# Patient Record
Sex: Male | Born: 1980 | Hispanic: No | Marital: Married | Smoking: Never smoker
Health system: Southern US, Community
[De-identification: ages and names within clinical notes are randomized; demographics above are authoritative.]

## PROBLEM LIST (undated history)

## (undated) DIAGNOSIS — I10 Essential (primary) hypertension: Secondary | ICD-10-CM

## (undated) HISTORY — PX: APPENDECTOMY: SHX54

## (undated) HISTORY — PX: HAND SURGERY: SHX662

---

## 2020-03-21 ENCOUNTER — Other Ambulatory Visit: Payer: Self-pay

## 2020-03-21 ENCOUNTER — Ambulatory Visit: Payer: HRSA Program | Attending: Internal Medicine

## 2020-03-21 DIAGNOSIS — Z20822 Contact with and (suspected) exposure to covid-19: Secondary | ICD-10-CM | POA: Diagnosis not present

## 2020-03-22 LAB — NOVEL CORONAVIRUS, NAA: SARS-CoV-2, NAA: NOT DETECTED

## 2020-03-22 LAB — SARS-COV-2, NAA 2 DAY TAT

## 2020-08-14 ENCOUNTER — Emergency Department: Payer: PRIVATE HEALTH INSURANCE

## 2020-08-14 ENCOUNTER — Other Ambulatory Visit: Payer: Self-pay

## 2020-08-14 ENCOUNTER — Emergency Department
Admission: EM | Admit: 2020-08-14 | Discharge: 2020-08-14 | Disposition: A | Discharge status: Home / Self Care | Payer: PRIVATE HEALTH INSURANCE | Attending: Emergency Medicine | Admitting: Emergency Medicine

## 2020-08-14 DIAGNOSIS — Y939 Activity, unspecified: Secondary | ICD-10-CM | POA: Insufficient documentation

## 2020-08-14 DIAGNOSIS — I10 Essential (primary) hypertension: Secondary | ICD-10-CM | POA: Insufficient documentation

## 2020-08-14 DIAGNOSIS — Y929 Unspecified place or not applicable: Secondary | ICD-10-CM | POA: Insufficient documentation

## 2020-08-14 DIAGNOSIS — Z79899 Other long term (current) drug therapy: Secondary | ICD-10-CM | POA: Insufficient documentation

## 2020-08-14 DIAGNOSIS — Y990 Civilian activity done for income or pay: Secondary | ICD-10-CM | POA: Insufficient documentation

## 2020-08-14 DIAGNOSIS — S161XXA Strain of muscle, fascia and tendon at neck level, initial encounter: Secondary | ICD-10-CM | POA: Insufficient documentation

## 2020-08-14 DIAGNOSIS — X58XXXA Exposure to other specified factors, initial encounter: Secondary | ICD-10-CM | POA: Insufficient documentation

## 2020-08-14 MED ORDER — ORPHENADRINE CITRATE ER 100 MG PO TB12: 100.0000 mg | ORAL_TABLET | Freq: Two times a day (BID) | ORAL | 1 refills | Status: DC | PRN

## 2020-08-14 MED ORDER — TRAMADOL HCL 50 MG PO TABS: 50.0000 mg | ORAL_TABLET | Freq: Four times a day (QID) | ORAL | 0 refills | Status: DC | PRN

## 2020-08-14 MED ORDER — AMLODIPINE BESYLATE 10 MG PO TABS: 10.0000 mg | ORAL_TABLET | Freq: Every day | ORAL | 2 refills | Status: DC
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
## 2020-08-14 NOTE — Discharge Instructions (Signed)
CT findings are consistent with muscle strain of the cervical spine.  Advised to take medication as directed.  It was also noted your blood pressure is elevated.  Will be given a prescription for Norvasc at 10 mg to take daily.  Advised establish care for PCP for management.  In the next 3 days advised to go to any pharmacy and have your blood pressure taken and recorded.  Take those readings to have a doctor he decides to establish care so he can have an average of your blood pressure readings.

## 2020-08-14 NOTE — ED Triage Notes (Signed)
Reports right sided neck pain X 8 months, "ball on my shoulder". Takes ibuprofen with relief, but pain back once ibuprofen wears off. Ambulates without difficulty.  Pt alert and oriented X4, cooperative, RR even and unlabored, color WNL. Pt in NAD.

## 2020-12-09 ENCOUNTER — Encounter: Payer: Self-pay | Admitting: Emergency Medicine

## 2020-12-09 ENCOUNTER — Other Ambulatory Visit: Payer: Self-pay

## 2020-12-09 ENCOUNTER — Emergency Department: Payer: HRSA Program

## 2020-12-09 ENCOUNTER — Emergency Department
Admission: EM | Admit: 2020-12-09 | Discharge: 2020-12-09 | Disposition: A | Discharge status: Home / Self Care | Payer: HRSA Program | Attending: Emergency Medicine | Admitting: Emergency Medicine

## 2020-12-09 DIAGNOSIS — U071 COVID-19: Secondary | ICD-10-CM | POA: Insufficient documentation

## 2020-12-09 DIAGNOSIS — I10 Essential (primary) hypertension: Secondary | ICD-10-CM | POA: Insufficient documentation

## 2020-12-09 DIAGNOSIS — R509 Fever, unspecified: Secondary | ICD-10-CM | POA: Diagnosis present

## 2020-12-09 DIAGNOSIS — Z79899 Other long term (current) drug therapy: Secondary | ICD-10-CM | POA: Insufficient documentation

## 2020-12-09 HISTORY — DX: Essential (primary) hypertension: I10

## 2020-12-09 LAB — RESP PANEL BY RT-PCR (FLU A&B, COVID) ARPGX2
Influenza A by PCR: NEGATIVE
Influenza B by PCR: NEGATIVE
SARS Coronavirus 2 by RT PCR: POSITIVE — AB

## 2020-12-09 MED ORDER — AMLODIPINE BESYLATE 10 MG PO TABS: 10.0000 mg | ORAL_TABLET | Freq: Every day | ORAL | 2 refills | Status: AC

## 2020-12-09 NOTE — ED Triage Notes (Addendum)
Patient to ER for c/o generalized body aches, fever (101-102), cough (nonproductive other than small amount of white phlegm intermittently). Patient reports being around someone that tested positive for Covid. Patient also reports chest tightness. Symptoms began on Friday.

## 2020-12-09 NOTE — ED Provider Notes (Signed)
[facility] Emergency Department Provider Note   ____________________________________________   Event Date/Time   First MD Initiated Contact with Patient 12/09/20 1016     (approximate)  I have reviewed the triage vital signs and the nursing notes.   HISTORY  Chief Complaint Fever, Generalized Body Aches, and Cough   HPI Steven Moran is a 39 y.o. male presents to the ED with complaint of generalized body aches, fever between 101-102, nonproductive cough that was initially productive and some tightness in his chest.  Patient states that he was around someone who tested positive for Covid.  Patient reports that he did get the [employer] vaccine in September.  Patient is not a smoker.  He denies any previous respiratory problems.  Rates pain as 7 out of 10.       Past Medical History:  Diagnosis Date  . Hypertension     There are no problems to display for this patient.   Past Surgical History:  Procedure Laterality Date  . APPENDECTOMY    . HAND SURGERY      Prior to Admission medications   Medication Sig Start Date End Date Taking? Authorizing Provider  amLODipine (NORVASC) 10 MG tablet Take 1 tablet (10 mg total) by mouth daily. 12/09/20 12/09/21  Tommi Rumps, PA-C    Allergies Patient has no known allergies.  No family history on file.  Social History Social History   Tobacco Use  . Smoking status: Never Smoker  . Smokeless tobacco: Never Used  Substance Use Topics  . Alcohol use: Never    Review of Systems Constitutional: Positive fever/chills Eyes: No visual changes. ENT: No sore throat. Cardiovascular: Denies chest pain. Respiratory: Denies shortness of breath.  Positive for cough. Gastrointestinal: No abdominal pain.  No nausea, no vomiting.  No diarrhea.  No constipation. Genitourinary: Negative for dysuria. Musculoskeletal: Positive for muscle skeletal pain. Skin: Negative for rash. Neurological:  Positive for headache, negative for focal weakness or numbness. ____________________________________________   PHYSICAL EXAM:  VITAL SIGNS: ED Triage Vitals  Enc Vitals Group     BP 12/09/20 0949 (!) 153/105     Pulse Rate 12/09/20 0949 96     Resp 12/09/20 0949 18     Temp 12/09/20 0949 98.7 F (37.1 C)     Temp Source 12/09/20 0949 Oral     SpO2 12/09/20 0949 96 %     Weight 12/09/20 0950 220 lb (99.8 kg)     Height 12/09/20 0950 5\' 10"  (1.778 m)     Head Circumference --      Peak Flow --      Pain Score 12/09/20 0949 7     Pain Loc --      Pain Edu? --      Excl. in GC? --     Constitutional: Alert and oriented. Well appearing and in no acute distress. Eyes: Conjunctivae are normal. PERRL. EOMI. Head: Atraumatic. Nose: No congestion/rhinnorhea. Neck: No stridor.   Hematological/Lymphatic/Immunilogical: No cervical lymphadenopathy. Cardiovascular: Normal rate, regular rhythm. Grossly normal heart sounds.  Good peripheral circulation. Respiratory: Normal respiratory effort.  No retractions. Lungs CTAB. Gastrointestinal: Soft and nontender. No distention. Musculoskeletal: Moves upper and lower extremities with any difficulty and normal gait was noted.. Neurologic:  Normal speech and language. No gross focal neurologic deficits are appreciated. No gait instability. Skin:  Skin is warm, dry and intact. No rash noted. Psychiatric: Mood and affect are normal. Speech and behavior are normal.  ____________________________________________  LABS (all labs ordered are listed, but only abnormal results are displayed)  Labs Reviewed  RESP PANEL BY RT-PCR (FLU A&B, COVID) ARPGX2 - Abnormal; Notable for the following components:      Result Value   SARS Coronavirus 2 by RT PCR POSITIVE (*)    All other components within normal limits   ____________________________________________  EKG Reviewed by doctors on the major ED. Sinus rhythm with ventricular rate of  88 ____________________________________________  RADIOLOGY I, Tommi Rumps, personally viewed and evaluated these images (plain radiographs) as part of my medical decision making, as well as reviewing the written report by the radiologist.   Official radiology report(s): DG Chest 2 View  Result Date: 12/09/2020 CLINICAL DATA:  Chest tightness, cough. Additional history provided: Patient reports generalized body aches, fever, cough, chest tightness, symptoms since Friday. Patient reports COVID exposure. EXAM: CHEST - 2 VIEW COMPARISON:  Prior chest radiographs 10/31/2020. FINDINGS: Heart size within normal limits. Subtle patchy opacities are questioned within the right lung base. The left lung is clear. No evidence of pleural effusion or pneumothorax. No acute bony abnormality identified. No acute bony abnormality identified. IMPRESSION: Subtle patchy opacities are questioned within the right lung base and early atypical/viral pneumonia is difficult to exclude. Consider short-interval radiographic follow-up. Electronically Signed   By: Jackey Loge DO   On: 12/09/2020 10:10    ____________________________________________   PROCEDURES  Procedure(s) performed (including Critical Care):  Procedures   ____________________________________________   INITIAL IMPRESSION / ASSESSMENT AND PLAN / ED COURSE  As part of my medical decision making, I reviewed the following data within the electronic MEDICAL RECORD NUMBER Notes from prior ED visits and [place] Controlled Substance Database  39 year old male presents to the ED with onset of symptoms 4 days ago of fever, cough, congestion and generalized body aches.  Patient was in close contact with someone who tested positive for Covid.  Respiratory panel was positive for Covid and chest x-ray shows changes suspicious for an early Covid pneumonia.  Patient was made aware and also we discussed quarantine and out of work.  Patient states he took his last  blood pressure medication yesterday and does not have an appointment a doctor.  Patient lives in [place].  A prescription for Norvasc 10 mg which he takes routinely was sent to his pharmacy with instructions to follow-up at an urgent care for recheck of his blood pressure after his time in quarantine is over.  Patient is aware that he should go to the nearest hospital should he develop shortness of breath or difficulty breathing.  He also is to contact people who he has been in contact with and also remain home with family members.  ____________________________________________   FINAL CLINICAL IMPRESSION(S) / ED DIAGNOSES  Final diagnoses:  COVID-19  Elevated blood pressure reading with diagnosis of hypertension     ED Discharge Orders         Ordered    amLODipine (NORVASC) 10 MG tablet  Daily        12/09/20 1209          *Please note:  Steven Moran was evaluated in Emergency Department on 12/09/2020 for the symptoms described in the history of present illness. He was evaluated in the context of the global COVID-19 pandemic, which necessitated consideration that the patient might be at risk for infection with the SARS-CoV-2 virus that causes COVID-19. Institutional protocols and algorithms that pertain to the evaluation of patients at risk for COVID-19 are in  a state of rapid change based on information released by regulatory bodies including the CDC and federal and state organizations. These policies and algorithms were followed during the patient's care in the ED.  Some ED evaluations and interventions may be delayed as a result of limited staffing during and the pandemic.*   Note:  This document was prepared using Dragon voice recognition software and may include unintentional dictation errors.    Tommi Rumps, PA-C 12/09/20 1341    Chesley Noon, MD 12/10/20 [phone]

## 2020-12-09 NOTE — Discharge Instructions (Signed)
Follow-up with an urgent care or primary care doctor for recheck of your blood pressure.  A prescription for blood pressure medication was sent to your pharmacy with 2 refills.  This should give you plenty of time to establish with a doctor or at least be seen by an urgent care.  Take blood pressure medication every day.  Also you were positive for Covid.  Chest x-ray does show some changes associated with Covid which may develop into Covid pneumonia.  There is no medication for Covid and you should quarantine for the next 10 to 14 days from the time of your symptoms.  Contact people that you have been around to let them know that they have been exposed to Covid.  Go to the nearest emergency department if any severe worsening of your symptoms such as shortness of breath or difficulty breathing.

## 2020-12-10 ENCOUNTER — Telehealth: Payer: Self-pay | Admitting: Physician Assistant

## 2020-12-10 NOTE — Telephone Encounter (Signed)
Called to discuss with Zoila Shutter about Covid symptoms and the use of  monoclonal antibody infusion for those with mild to moderate Covid symptoms and at a high risk of hospitalization.     Pt is qualified for this infusion due to co-morbid conditions and/or a member of an at-risk group, however declines infusion at this time.  Symptoms reviewed as well as criteria for ending isolation.  Symptoms reviewed that would warrant ED/Hospital evaluation. Preventative practices reviewed. Patient verbalized understanding. Patient advised to call back if he decides that he does want to get infusion. Callback number to the infusion center given. Patient advised to go to Urgent care or ED with severe symptoms. Last date he would be eligible for infusion is 12/11/20.   Risk factors: HTN and BMI > 25.  Manson Passey, [place]

## 2021-04-01 ENCOUNTER — Encounter: Payer: Self-pay | Admitting: *Deleted

## 2021-04-01 NOTE — Progress Notes (Signed)
Reached out to Zoila Shutter to introduce myself as the office RN Navigator and explain our new patient process. Reviewed the reason for their referral and scheduled their new patient appointment along with labs. Provided address and directions to the office including call back phone number. Reviewed with patient any concerns they may have or any possible barriers to attending their appointment.   Informed patient about my role as a navigator and that I will meet with them prior to their New Patient appointment and more fully discuss what services I can provide. At this time patient has no further questions or needs.   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

## 2021-04-04 ENCOUNTER — Inpatient Hospital Stay: Payer: BC Managed Care – PPO

## 2021-04-04 ENCOUNTER — Inpatient Hospital Stay: Payer: BC Managed Care – PPO | Admitting: Hematology & Oncology

## 2021-04-16 ENCOUNTER — Encounter (HOSPITAL_BASED_OUTPATIENT_CLINIC_OR_DEPARTMENT_OTHER): Payer: Self-pay

## 2021-04-16 ENCOUNTER — Other Ambulatory Visit (HOSPITAL_BASED_OUTPATIENT_CLINIC_OR_DEPARTMENT_OTHER): Payer: Self-pay | Admitting: Internal Medicine

## 2021-04-16 ENCOUNTER — Ambulatory Visit (HOSPITAL_BASED_OUTPATIENT_CLINIC_OR_DEPARTMENT_OTHER)
Admission: RE | Admit: 2021-04-16 | Discharge: 2021-04-16 | Disposition: A | Discharge status: Home / Self Care | Payer: BC Managed Care – PPO | Source: Ambulatory Visit | Attending: Internal Medicine | Admitting: Internal Medicine

## 2021-04-16 ENCOUNTER — Other Ambulatory Visit: Payer: Self-pay

## 2021-04-16 DIAGNOSIS — R590 Localized enlarged lymph nodes: Secondary | ICD-10-CM

## 2021-04-16 MED ORDER — IOHEXOL 300 MG/ML  SOLN
100.0000 mL | Freq: Once | INTRAMUSCULAR | Status: AC | PRN
  Administered 2021-04-16: 100 mL via INTRAVENOUS

## 2021-04-18 ENCOUNTER — Other Ambulatory Visit: Payer: Self-pay

## 2021-04-18 ENCOUNTER — Encounter: Payer: Self-pay | Admitting: *Deleted

## 2021-04-18 ENCOUNTER — Other Ambulatory Visit: Payer: Self-pay | Admitting: Family

## 2021-04-18 ENCOUNTER — Encounter: Payer: Self-pay | Admitting: Hematology & Oncology

## 2021-04-18 ENCOUNTER — Inpatient Hospital Stay (HOSPITAL_BASED_OUTPATIENT_CLINIC_OR_DEPARTMENT_OTHER): Payer: BC Managed Care – PPO | Admitting: Hematology & Oncology

## 2021-04-18 ENCOUNTER — Other Ambulatory Visit (HOSPITAL_BASED_OUTPATIENT_CLINIC_OR_DEPARTMENT_OTHER): Payer: Self-pay

## 2021-04-18 ENCOUNTER — Inpatient Hospital Stay: Payer: BC Managed Care – PPO | Attending: Hematology & Oncology

## 2021-04-18 VITALS — BP 169/101 | HR 89 | Temp 99.1°F | Resp 19 | Wt 207.0 lb

## 2021-04-18 DIAGNOSIS — Z9049 Acquired absence of other specified parts of digestive tract: Secondary | ICD-10-CM

## 2021-04-18 DIAGNOSIS — R590 Localized enlarged lymph nodes: Secondary | ICD-10-CM | POA: Diagnosis present

## 2021-04-18 DIAGNOSIS — Z801 Family history of malignant neoplasm of trachea, bronchus and lung: Secondary | ICD-10-CM | POA: Diagnosis not present

## 2021-04-18 DIAGNOSIS — R509 Fever, unspecified: Secondary | ICD-10-CM | POA: Diagnosis not present

## 2021-04-18 DIAGNOSIS — Z79899 Other long term (current) drug therapy: Secondary | ICD-10-CM | POA: Diagnosis not present

## 2021-04-18 DIAGNOSIS — Z808 Family history of malignant neoplasm of other organs or systems: Secondary | ICD-10-CM

## 2021-04-18 DIAGNOSIS — R61 Generalized hyperhidrosis: Secondary | ICD-10-CM | POA: Diagnosis not present

## 2021-04-18 DIAGNOSIS — R519 Headache, unspecified: Secondary | ICD-10-CM | POA: Insufficient documentation

## 2021-04-18 DIAGNOSIS — H538 Other visual disturbances: Secondary | ICD-10-CM

## 2021-04-18 DIAGNOSIS — M542 Cervicalgia: Secondary | ICD-10-CM

## 2021-04-18 DIAGNOSIS — I10 Essential (primary) hypertension: Secondary | ICD-10-CM | POA: Diagnosis not present

## 2021-04-18 LAB — CBC WITH DIFFERENTIAL (CANCER CENTER ONLY)
Abs Immature Granulocytes: 0.02 10*3/uL (ref 0.00–0.07)
Basophils Absolute: 0.1 10*3/uL (ref 0.0–0.1)
Basophils Relative: 1 %
Eosinophils Absolute: 0.2 10*3/uL (ref 0.0–0.5)
Eosinophils Relative: 3 %
HCT: 47.7 % (ref 39.0–52.0)
Hemoglobin: 16.2 g/dL (ref 13.0–17.0)
Immature Granulocytes: 0 %
Lymphocytes Relative: 30 %
Lymphs Abs: 2 10*3/uL (ref 0.7–4.0)
MCH: 28.6 pg (ref 26.0–34.0)
MCHC: 34 g/dL (ref 30.0–36.0)
MCV: 84.3 fL (ref 80.0–100.0)
Monocytes Absolute: 0.4 10*3/uL (ref 0.1–1.0)
Monocytes Relative: 7 %
Neutro Abs: 3.8 10*3/uL (ref 1.7–7.7)
Neutrophils Relative %: 59 %
Platelet Count: 251 10*3/uL (ref 150–400)
RBC: 5.66 MIL/uL (ref 4.22–5.81)
RDW: 13 % (ref 11.5–15.5)
WBC Count: 6.5 10*3/uL (ref 4.0–10.5)
nRBC: 0 % (ref 0.0–0.2)

## 2021-04-18 LAB — CMP (CANCER CENTER ONLY)
ALT: 28 U/L (ref 0–44)
AST: 19 U/L (ref 15–41)
Albumin: 4.5 g/dL (ref 3.5–5.0)
Alkaline Phosphatase: 49 U/L (ref 38–126)
Anion gap: 8 (ref 5–15)
BUN: 21 mg/dL — ABNORMAL HIGH (ref 6–20)
CO2: 29 mmol/L (ref 22–32)
Calcium: 10 mg/dL (ref 8.9–10.3)
Chloride: 102 mmol/L (ref 98–111)
Creatinine: 1.08 mg/dL (ref 0.61–1.24)
GFR, Estimated: >60 mL/min (ref >60)
Glucose, Bld: 143 mg/dL — ABNORMAL HIGH (ref 70–99)
Potassium: 3.9 mmol/L (ref 3.5–5.1)
Sodium: 139 mmol/L (ref 135–145)
Total Bilirubin: 0.3 mg/dL (ref 0.3–1.2)
Total Protein: 7.7 g/dL (ref 6.5–8.1)

## 2021-04-18 LAB — LACTATE DEHYDROGENASE: LDH: 149 U/L (ref 98–192)

## 2021-04-18 MED ORDER — TRAMADOL HCL 50 MG PO TABS
50.0000 mg | ORAL_TABLET | Freq: Four times a day (QID) | ORAL | 0 refills | Status: AC | PRN
  Filled 2021-04-18: qty 90, 23d supply, fill #0

## 2021-04-18 NOTE — Progress Notes (Signed)
Patient plan requires no authorization. PET and MRI scheduled for patient.   Spoke to patient and reviewed appointments and prep for both scans. Also mailed a radiography info sheet to the patient for education reinforcement.  PET - patient knows to be NPO after MN and he can only drink water. He is not diabetic.  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 He will need to arrive early to his MRI to have an xray of his orbit to eval for metal shavings. Patient is aware, radiology is aware and orders placed.   Oncology Nurse Navigator Documentation  Oncology Nurse Navigator Flowsheets 04/18/2021  Abnormal Finding Date -  Diagnosis Status -  Navigator Follow Up Date: 04/28/2021  Navigator Follow Up Reason: Scan Review  Navigator Location [facility]  Referral Date to RadOnc/MedOnc -  Navigator Encounter Type Telephone;Appt/Treatment Plan Review  Telephone Appt Confirmation/Clarification;Outgoing Call  Patient Visit Type MedOnc  Treatment Phase Abnormal Scans  Barriers/Navigation Needs Coordination of Care;Education  Education Other  Interventions Coordination of Care;Education;Psycho-Social Support  Acuity Level 2-Minimal Needs (1-2 Barriers Identified)  Coordination of Care Appts;Radiology  Education Method Verbal;Written  Support Groups/Services Friends and Family  Time Spent with Patient 60

## 2021-04-18 NOTE — Progress Notes (Signed)
Referral MD  Reason for Referral: Cervical lymphadenopathy-right neck.  No chief complaint on file. : I have been having pain in my neck and fevers then night sweats.  HPI: Ms. Skillern is a very nice 40 year old male.  He is originally from [place].  It sounds like he is from [place].  He comes in with his wife.  His wife is from [place].  She speaks [place].  He actually has been incarcerated.  This is down in [place].  He currently is a Psychologist, occupational.  He is working on a job at [employer].  For the past 5 months, he has been having some issues.  He has been having some pain.  This is in the right neck.  He has had some fullness in the right neck.  He has had some fevers and night sweats.  He has had some weight loss.  His appetite is doing okay.  There is no change in bowel or bladder habits.  He may be little constipated.  Is been taking quite a bit of Tylenol.  He is also been taking some nonsteroidals.  He was put on some gabapentin which he says does not help.  He ultimately had a CT scan of the neck done.  This was done on 04/16/2021.  CT scan of the neck showed some enlarged right level 2 lymph nodes.  One measured 18 mm.  The other measured 12 mm.  There is some additional lymph nodes within bilateral neck which are prominent.  This lymphadenopathy was nonspecific.  A CT scan of the chest was also done on 04/16/2021.  The CT scan of the chest did not show any mediastinal or hilar lymph nodes.  He has had no rashes.  He has had no bleeding.  He used to smoke but stopped about a week or so ago.  He has had situation where he has had he lost vision in his left eye.  He was at a [employer].  He had sharp pain in his head.  He then lost vision in the left eye for a few seconds.  Not sure exactly what this might indicate.  There is history of lymphoma and lung cancer in the family.  He has had maternal grandfather had this.  He has had his appendix taken out.  He has had no  other surgery.  Currently, his performance status is ECOG 1.    No past medical history on file.:  Past Surgical History:  Procedure Laterality Date  . APPENDECTOMY    . HAND SURGERY    :   Current Outpatient Medications:  .  traMADol (ULTRAM) 50 MG tablet, Take 1 tablet (50 mg total) by mouth every 6 (six) hours as needed., Disp: 90 tablet, Rfl: 0 .  amLODipine (NORVASC) 10 MG tablet, Take 1 tablet (10 mg total) by mouth daily., Disp: 30 tablet, Rfl: 2:  :  No Known Allergies:  No family history on file.:  Social History   Socioeconomic History  . Marital status: Married    Spouse name: Not on file  . Number of children: Not on file  . Years of education: Not on file  . Highest education level: Not on file  Occupational History  . Not on file  Tobacco Use  . Smoking status: Never Smoker  . Smokeless tobacco: Never Used  Substance and Sexual Activity  . Alcohol use: Never  . Drug use: Not on file  . Sexual activity: Not on file  Other  Topics Concern  . Not on file  Social History Narrative  . Not on file   Social Determinants of Health   Financial Resource Strain: Not on file  Food Insecurity: Not on file  Transportation Needs: Not on file  Physical Activity: Not on file  Stress: Not on file  Social Connections: Not on file  Intimate Partner Violence: Not on file  :  Review of Systems  Constitutional: Positive for fever.  HENT: Negative.   Eyes: Positive for blurred vision.  Respiratory: Negative.   Cardiovascular: Negative.   Gastrointestinal: Negative.   Genitourinary: Negative.   Musculoskeletal: Negative.   Skin: Negative.   Neurological: Positive for headaches.  Endo/Heme/Allergies: Negative.   Psychiatric/Behavioral: Negative.      Exam:  This is a well-developed well-nourished male in no obvious distress.  Vital signs show temperature of 99.1.  Pulse 89.  Blood pressure 169/101.  Weight is 207 pounds.  Head and neck exam shows no  ocular or oral lesions.  He has no scleral icterus.  There is some slight fullness in the right neck.  He has some tenderness to palpation in the neck bilaterally.  He has good extraocular muscle movement.  Pupils react appropriately.  Lungs are clear bilaterally.  Cardiac exam regular rate and rhythm with no murmurs, rubs or bruits.  Abdomen is soft.  He has good bowel sounds.  There is no fluid wave.  There is no guarding or rebound tenderness.  There is no palpable liver or spleen tip.  Axillary exam shows no bilateral axillary adenopathy.  Back exam shows no tenderness over the spine, ribs or hips.  Extremities shows no clubbing, cyanosis or edema.  He has good range of motion of his joints.  He has good strength bilaterally.  Skin exam shows no rashes, ecchymoses or petechia.  Neurological exam shows no focal neurological deficits.   @IPVITALS @   Recent Labs    04/18/21 1020  WBC 6.5  HGB 16.2  HCT 47.7  PLT 251   Recent Labs    04/18/21 1020  NA 139  K 3.9  CL 102  CO2 29  GLUCOSE 143*  BUN 21*  CREATININE 1.08  CALCIUM 10.0    Blood smear review: None  Pathology: None    Assessment and Plan: Mr. Scheaffer is a 40 year old male.  It is hard to say if there is any hematologic issue here.  I am not sure exactly what all the pain is coming from.  Again, the CT scan is certainly nonspecific for the neck.  There is no adenopathy in the chest.  I would be surprised if he had a hematologic issue.  I would think that at his age, Hodgkin's disease would be likely.  Again, I just have not that impressed with his exam.  We will see about getting a PET scan on him.  I think this will be reasonable.  We will see if the PET scan shows any malignant activity.  Ultimately, he may need to have a biopsy done.  Again is hard to palpate any adenopathy that could be removed.  I am a little more concerned about this visual issue that he had.  We will have to see about MRI of the brain.  He  does have hypertension so there might be a cerebrovascular issue.  It is just hard to really know what to recommend at this point.  Again the x-ray reports are nonspecific.  His exam is not revealing for anything that  is obvious.  He is very nice.  It was fun talking to him and his wife.  We will get back with him once we have results and from the scans.

## 2021-04-18 NOTE — Progress Notes (Signed)
Patient CT results from Wednesday not yet available. Called reading room and had them expedite reading for today appointment.  Initial RN Navigator Patient Visit  Name: Steven Moran Date of Referral :  03/31/2021 Diagnosis: Lymphadenopathy  Met with patient and his wife prior to their visit with MD. Reviewed my role, areas in which I am able to help, and all the contact information for myself and the office.Patient given Navigator business card. Reviewed with patient the general overview of expected course and time frame for all steps to be completed.  Patient completed visit with Dr. Marin Olp  Patient needs an MRI and PET scan. These need prior authorization. Message sent to the patient given a timeline for scheduling expectations. Will schedule once authorization is received.   Patient understands all follow up procedures and expectations. They have my number to reach out for any further clarification or additional needs.   Oncology Nurse Navigator Documentation  Oncology Nurse Navigator Flowsheets 04/18/2021  Abnormal Finding Date -  Diagnosis Status -  Navigator Follow Up Date: 04/22/2021  Navigator Follow Up Reason: Appointment Review  Navigator Location [facility]  Referral Date to RadOnc/MedOnc 03/31/2021  Navigator Encounter Type Initial MedOnc;MyChart  Telephone -  Patient Visit Type MedOnc  Treatment Phase Abnormal Scans  Barriers/Navigation Needs Coordination of Care;Education  Education Other  Interventions Coordination of Care;Education;Psycho-Social Support  Acuity Level 2-Minimal Needs (1-2 Barriers Identified)  Coordination of Care -  Education Method Verbal  Support Groups/Services Friends and Family  Time Spent with Patient 66

## 2021-04-19 LAB — BETA 2 MICROGLOBULIN, SERUM: Beta-2 Microglobulin: 1.1 mg/L (ref 0.6–2.4)

## 2021-04-21 ENCOUNTER — Telehealth: Payer: Self-pay

## 2021-04-21 NOTE — Telephone Encounter (Signed)
No 04/18/21 los   Steven Moran

## 2021-04-23 ENCOUNTER — Ambulatory Visit: Payer: BC Managed Care – PPO | Admitting: Family Medicine

## 2021-04-23 ENCOUNTER — Other Ambulatory Visit (HOSPITAL_BASED_OUTPATIENT_CLINIC_OR_DEPARTMENT_OTHER): Payer: Self-pay

## 2021-04-23 ENCOUNTER — Other Ambulatory Visit: Payer: Self-pay | Admitting: Family

## 2021-04-26 ENCOUNTER — Ambulatory Visit (HOSPITAL_BASED_OUTPATIENT_CLINIC_OR_DEPARTMENT_OTHER)
Admission: RE | Admit: 2021-04-26 | Discharge: 2021-04-26 | Disposition: A | Discharge status: Home / Self Care | Payer: BC Managed Care – PPO | Source: Ambulatory Visit | Attending: Hematology & Oncology | Admitting: Hematology & Oncology

## 2021-04-26 ENCOUNTER — Ambulatory Visit (HOSPITAL_BASED_OUTPATIENT_CLINIC_OR_DEPARTMENT_OTHER)
Admission: RE | Admit: 2021-04-26 | Discharge: 2021-04-26 | Disposition: A | Discharge status: Home / Self Care | Payer: BC Managed Care – PPO | Source: Ambulatory Visit | Attending: Family | Admitting: Family

## 2021-04-26 ENCOUNTER — Other Ambulatory Visit: Payer: Self-pay

## 2021-04-26 DIAGNOSIS — R590 Localized enlarged lymph nodes: Secondary | ICD-10-CM | POA: Diagnosis present

## 2021-04-26 MED ORDER — GADOBUTROL 1 MMOL/ML IV SOLN
9.0000 mL | Freq: Once | INTRAVENOUS | Status: AC | PRN
  Administered 2021-04-26: 9 mL via INTRAVENOUS

## 2021-04-28 ENCOUNTER — Encounter (HOSPITAL_COMMUNITY)
Admission: RE | Admit: 2021-04-28 | Discharge: 2021-04-28 | Disposition: A | Discharge status: Home / Self Care | Payer: BC Managed Care – PPO | Source: Ambulatory Visit | Attending: Hematology & Oncology | Admitting: Hematology & Oncology

## 2021-04-28 ENCOUNTER — Other Ambulatory Visit: Payer: Self-pay

## 2021-04-28 ENCOUNTER — Encounter: Payer: Self-pay | Admitting: *Deleted

## 2021-04-28 DIAGNOSIS — R590 Localized enlarged lymph nodes: Secondary | ICD-10-CM | POA: Insufficient documentation

## 2021-04-28 LAB — GLUCOSE, CAPILLARY: Glucose-Capillary: 95 mg/dL (ref 70–99)

## 2021-04-28 MED ORDER — FLUDEOXYGLUCOSE F - 18 (FDG) INJECTION
10.4000 | Freq: Once | INTRAVENOUS | Status: AC
  Administered 2021-04-28: 10.34 via INTRAVENOUS

## 2021-04-28 NOTE — Progress Notes (Signed)
Patient calling wanting update on his disability paperwork which was turned into the office over a week ago.   Spoke to provider and she is waiting for a diagnosis as the disability requires this. We are still working patient up and his diagnosis is unknown. Without this diagnosis, paperwork cannot be completed.   Patient notified of issue. He understands. We will keep him updated.   Oncology Nurse Navigator Documentation  Oncology Nurse Navigator Flowsheets 04/28/2021  Abnormal Finding Date -  Diagnosis Status -  Navigator Follow Up Date: 04/29/2021  Navigator Follow Up Reason: Scan Review  Navigator Location [facility]  Referral Date to RadOnc/MedOnc -  Navigator Encounter Type Telephone  Telephone FMLA/Disability;Incoming Call  Patient Visit Type MedOnc  Treatment Phase Abnormal Scans  Barriers/Navigation Needs Coordination of Care;Education  Education Other  Interventions Disability/FMLA  Acuity Level 2-Minimal Needs (1-2 Barriers Identified)  Coordination of Care Other  Education Method Verbal  Support Groups/Services Friends and Family  Time Spent with Patient -

## 2021-04-29 ENCOUNTER — Encounter: Payer: Self-pay | Admitting: *Deleted

## 2021-04-29 NOTE — Progress Notes (Signed)
Patient's MRI and PET scan are both negative for malignant process. Patient given results of both scans and reports faxed to his PCP.  Per Dr Myna Hidalgo, since workup has been negative for cancer, patient needs to be sent back to his PCP for further workup. Disability paperwork also needs to be forwarded to PCP.  Called patient and reviewed results and need to return to PCP. He is actually in PCP office during my call. He is aware that he doesn't need to follow up with our office. He asks that disability papers be faxed to Dr Betsy Coder (done).  Called and spoke to Dr Brandt Loosen office. They are aware that we are closing the referral with no diagnosis found. CMA will notify Dr Betsy Coder of our findings while patient is in their office.   Oncology Nurse Navigator Documentation  Oncology Nurse Navigator Flowsheets 04/29/2021  Abnormal Finding Date -  Diagnosis Status -  Navigator Follow Up Date: -  Navigator Follow Up Reason: -  Navigation Complete Date: 04/29/2021  Post Navigation: Continue to Follow Patient? No  Reason Not Navigating Patient: No Cancer Diagnosis  Navigator Location [facility]  Referral Date to RadOnc/MedOnc -  Navigator Encounter Type Scan Review;Telephone  Telephone Outgoing Call  Patient Visit Type MedOnc  Treatment Phase Abnormal Scans  Barriers/Navigation Needs Coordination of Care;Education  Education Other  Interventions Coordination of Care;Education;Psycho-Social Support  Acuity Level 2-Minimal Needs (1-2 Barriers Identified)  Coordination of Care Other  Education Method Verbal  Support Groups/Services Friends and Family  Time Spent with Patient 30

## 2021-06-14 ENCOUNTER — Emergency Department (HOSPITAL_BASED_OUTPATIENT_CLINIC_OR_DEPARTMENT_OTHER): Payer: Self-pay

## 2021-06-14 ENCOUNTER — Other Ambulatory Visit: Payer: Self-pay

## 2021-06-14 ENCOUNTER — Emergency Department (HOSPITAL_BASED_OUTPATIENT_CLINIC_OR_DEPARTMENT_OTHER)
Admission: EM | Admit: 2021-06-14 | Discharge: 2021-06-14 | Disposition: A | Discharge status: Home / Self Care | Payer: Self-pay | Attending: Emergency Medicine | Admitting: Emergency Medicine

## 2021-06-14 ENCOUNTER — Encounter (HOSPITAL_BASED_OUTPATIENT_CLINIC_OR_DEPARTMENT_OTHER): Payer: Self-pay

## 2021-06-14 DIAGNOSIS — I10 Essential (primary) hypertension: Secondary | ICD-10-CM | POA: Insufficient documentation

## 2021-06-14 DIAGNOSIS — Z79899 Other long term (current) drug therapy: Secondary | ICD-10-CM | POA: Insufficient documentation

## 2021-06-14 DIAGNOSIS — M542 Cervicalgia: Secondary | ICD-10-CM | POA: Insufficient documentation

## 2021-06-14 DIAGNOSIS — G4486 Cervicogenic headache: Secondary | ICD-10-CM | POA: Insufficient documentation

## 2021-06-14 LAB — CREATININE, SERUM
Creatinine, Ser: 1.03 mg/dL (ref 0.61–1.24)
GFR, Estimated: >60 mL/min (ref >60)

## 2021-06-14 MED ORDER — DIPHENHYDRAMINE HCL 50 MG/ML IJ SOLN
25.0000 mg | Freq: Once | INTRAMUSCULAR | Status: AC
  Administered 2021-06-14: 25 mg via INTRAMUSCULAR
  Filled 2021-06-14: qty 1

## 2021-06-14 MED ORDER — MORPHINE SULFATE (PF) 4 MG/ML IV SOLN
4.0000 mg | Freq: Once | INTRAVENOUS | Status: AC
  Administered 2021-06-14: 4 mg via INTRAVENOUS
  Filled 2021-06-14: qty 1

## 2021-06-14 MED ORDER — PROCHLORPERAZINE EDISYLATE 10 MG/2ML IJ SOLN
10.0000 mg | Freq: Once | INTRAMUSCULAR | Status: AC
  Administered 2021-06-14: 10 mg via INTRAMUSCULAR
  Filled 2021-06-14: qty 2

## 2021-06-14 MED ORDER — IOHEXOL 300 MG/ML  SOLN
75.0000 mL | Freq: Once | INTRAMUSCULAR | Status: AC | PRN
  Administered 2021-06-14: 75 mL via INTRAVENOUS

## 2021-06-14 MED ORDER — KETOROLAC TROMETHAMINE 15 MG/ML IJ SOLN
15.0000 mg | Freq: Once | INTRAMUSCULAR | Status: AC
  Administered 2021-06-14: 15 mg via INTRAMUSCULAR
  Filled 2021-06-14: qty 1

## 2021-06-14 NOTE — ED Triage Notes (Signed)
Pt states he had a biopsy on Monday removing a lymph node and "2 other balls" on his right neck. 3 days ago started having headache, light sensitivity, nausea & numbness to his left elbow. Hypertensive during triage, states took hydralazine this morning.
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
## 2021-06-14 NOTE — Discharge Instructions (Addendum)
Return for worsening symptoms.    

## 2022-01-24 IMAGING — DX DG ORBITS FOR FOREIGN BODY
2 series · 2 of 2 positions shown · non-contrast
Comparison: None.

CLINICAL DATA: Metal working/exposure; clearance prior to MRI

EXAM:
ORBITS FOR FOREIGN BODY - 2 VIEW

[orbits waters (1 of 2)]
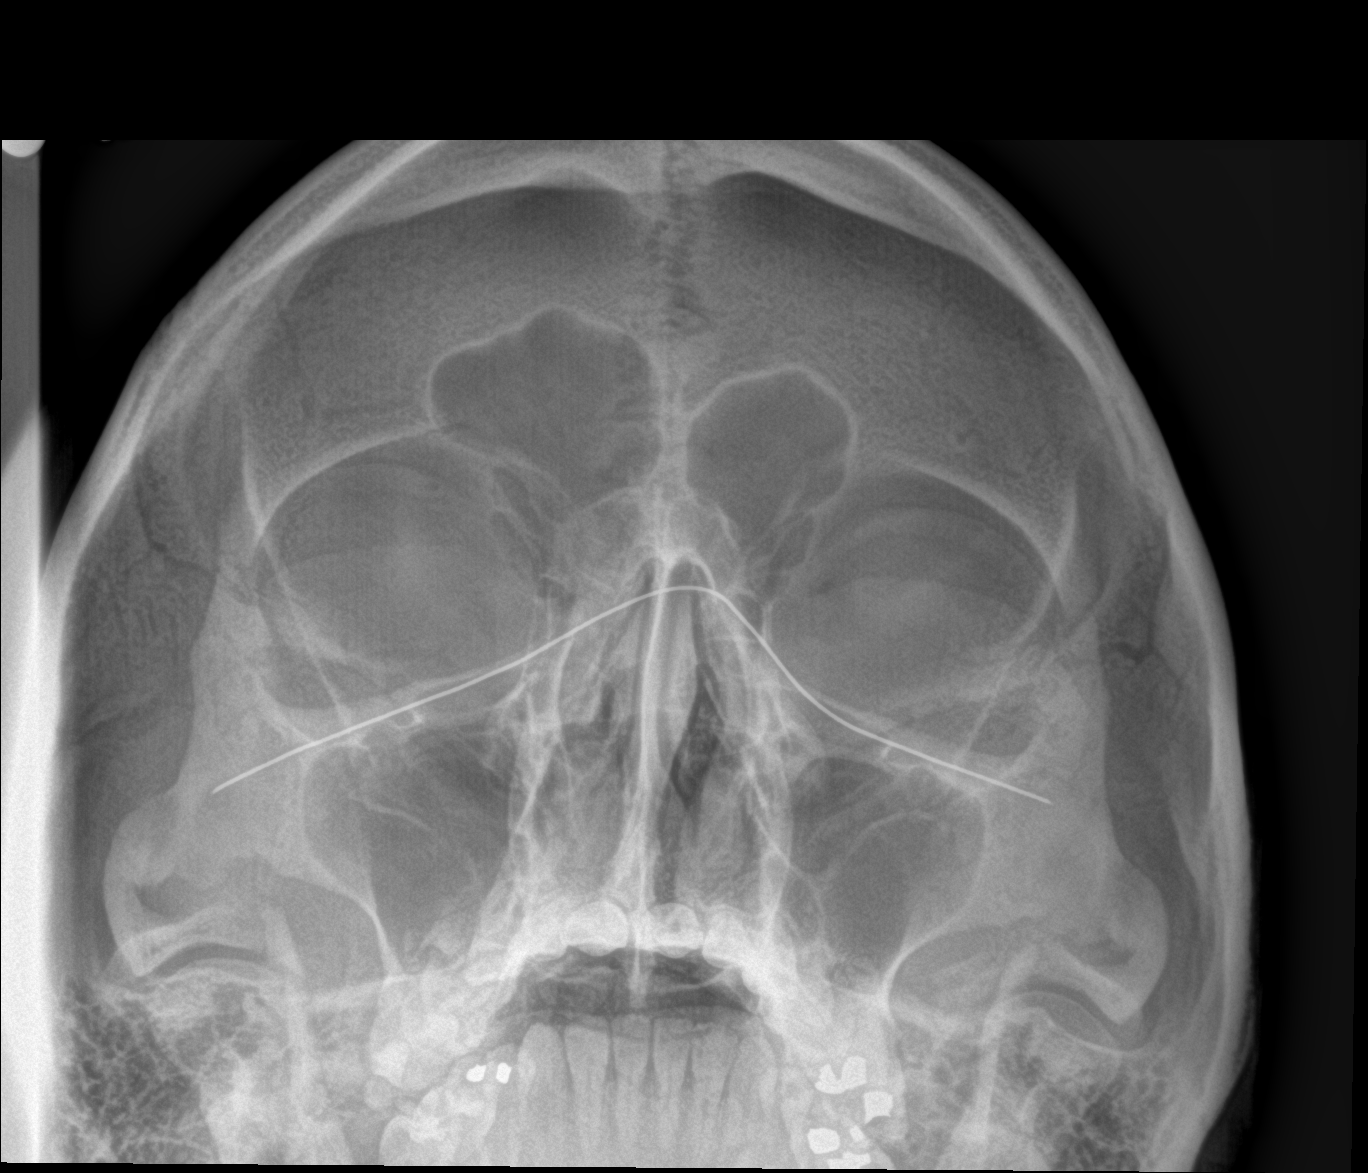

[orbits waters (2 of 2)]
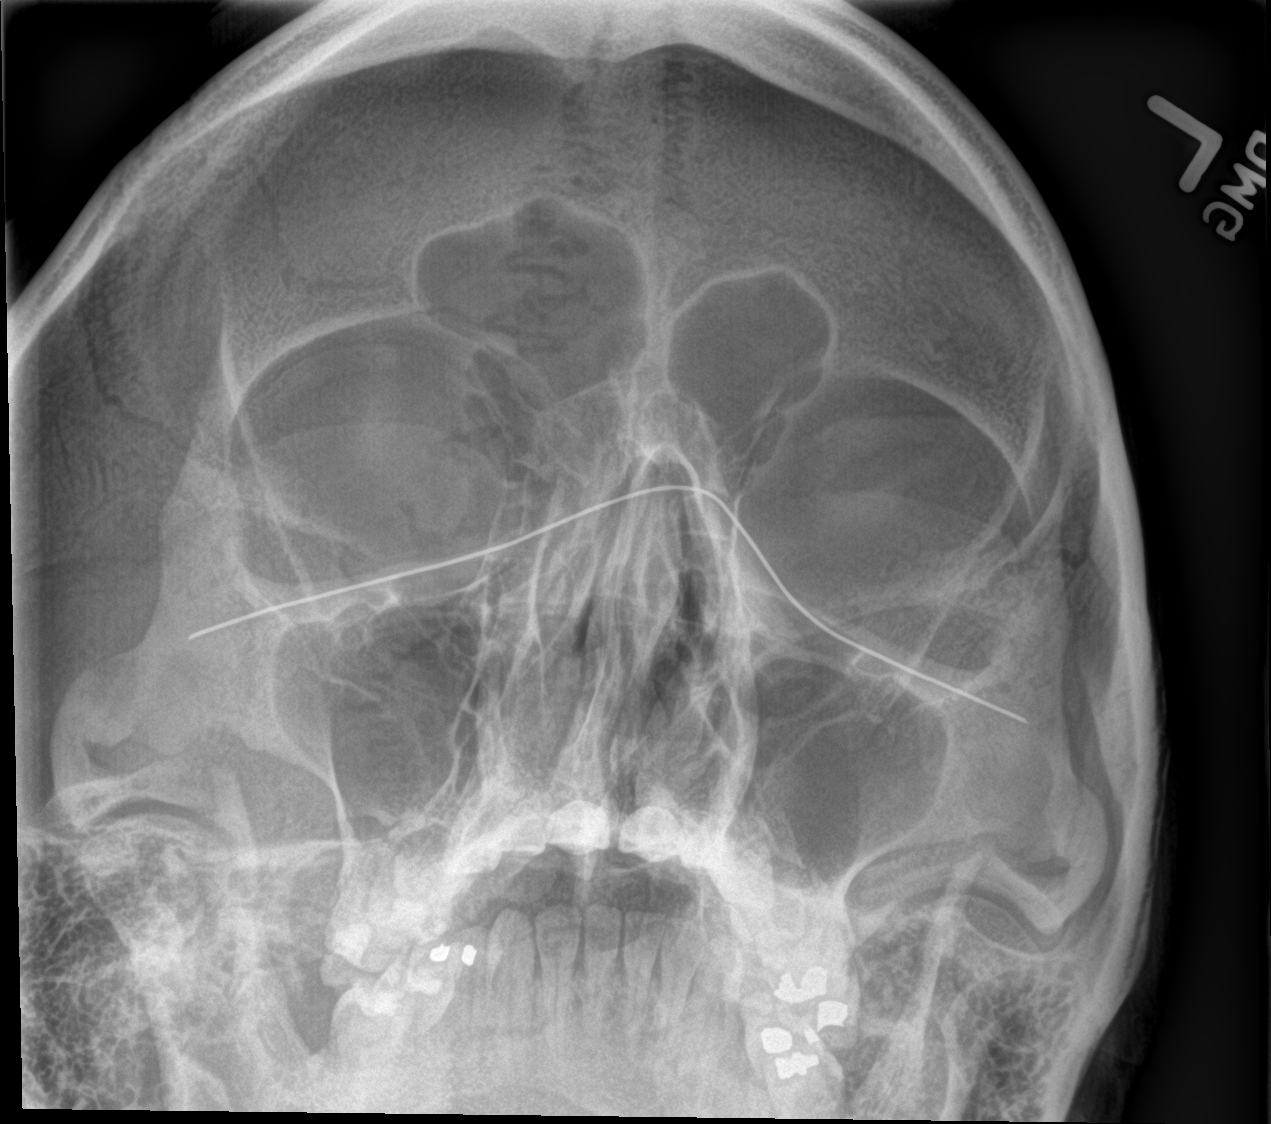

[2 of 2 positions shown; findings below may reference images not displayed]

FINDINGS: Curvilinear horizontal metallic line from external mask overlies the
facial structures. There is no evidence of metallic foreign body
within the orbits. No significant bone abnormality identified.
Metallic dental fillings.
IMPRESSION: No evidence of metallic foreign body within the orbits.

## 2022-05-19 ENCOUNTER — Other Ambulatory Visit (HOSPITAL_COMMUNITY): Payer: Self-pay

## 2022-07-24 ENCOUNTER — Other Ambulatory Visit (HOSPITAL_COMMUNITY): Payer: Self-pay
